# Patient Record
Sex: Male | Born: 1940 | Race: White | Hispanic: No | Smoking: Never smoker
Health system: Southern US, Community
[De-identification: ages and names within clinical notes are randomized; demographics above are authoritative.]

## PROBLEM LIST (undated history)

## (undated) DIAGNOSIS — I509 Heart failure, unspecified: Secondary | ICD-10-CM

---

## 2005-11-05 ENCOUNTER — Encounter
Admission: RE | Admit: 2005-11-05 | Discharge: 2005-11-05 | Payer: Self-pay | Admitting: Physical Medicine and Rehabilitation

## 2006-01-09 ENCOUNTER — Encounter
Admission: RE | Admit: 2006-01-09 | Discharge: 2006-01-09 | Payer: Self-pay | Admitting: Physical Medicine and Rehabilitation

## 2006-01-25 ENCOUNTER — Encounter: Admission: RE | Admit: 2006-01-25 | Discharge: 2006-01-25 | Payer: Self-pay | Admitting: Orthopedic Surgery

## 2006-04-18 ENCOUNTER — Encounter: Admission: RE | Admit: 2006-04-18 | Discharge: 2006-04-18 | Payer: Self-pay | Admitting: Orthopedic Surgery

## 2006-12-25 ENCOUNTER — Observation Stay (HOSPITAL_COMMUNITY): Admission: AD | Admit: 2006-12-25 | Discharge: 2006-12-26 | Payer: Self-pay | Admitting: Neurological Surgery

## 2007-05-29 ENCOUNTER — Ambulatory Visit: Payer: Self-pay | Admitting: Thoracic Surgery (Cardiothoracic Vascular Surgery)

## 2007-07-30 ENCOUNTER — Ambulatory Visit: Payer: Self-pay | Admitting: Cardiothoracic Surgery

## 2007-08-18 ENCOUNTER — Inpatient Hospital Stay (HOSPITAL_COMMUNITY): Admission: RE | Admit: 2007-08-18 | Discharge: 2007-08-21 | Payer: Self-pay | Admitting: Orthopedic Surgery

## 2007-09-02 ENCOUNTER — Encounter: Admission: RE | Admit: 2007-09-02 | Discharge: 2007-09-02 | Payer: Self-pay | Admitting: Orthopedic Surgery

## 2008-03-15 ENCOUNTER — Encounter: Admission: RE | Admit: 2008-03-15 | Discharge: 2008-03-15 | Payer: Self-pay | Admitting: Neurological Surgery

## 2009-02-07 IMAGING — CR DG CHEST 2V
2 series · 2 of 2 positions shown · non-contrast
Comparison: None

CLINICAL DATA: Lumbar HNP, preop

CHEST - 2 VIEW:

[view not recorded (1 of 2)]
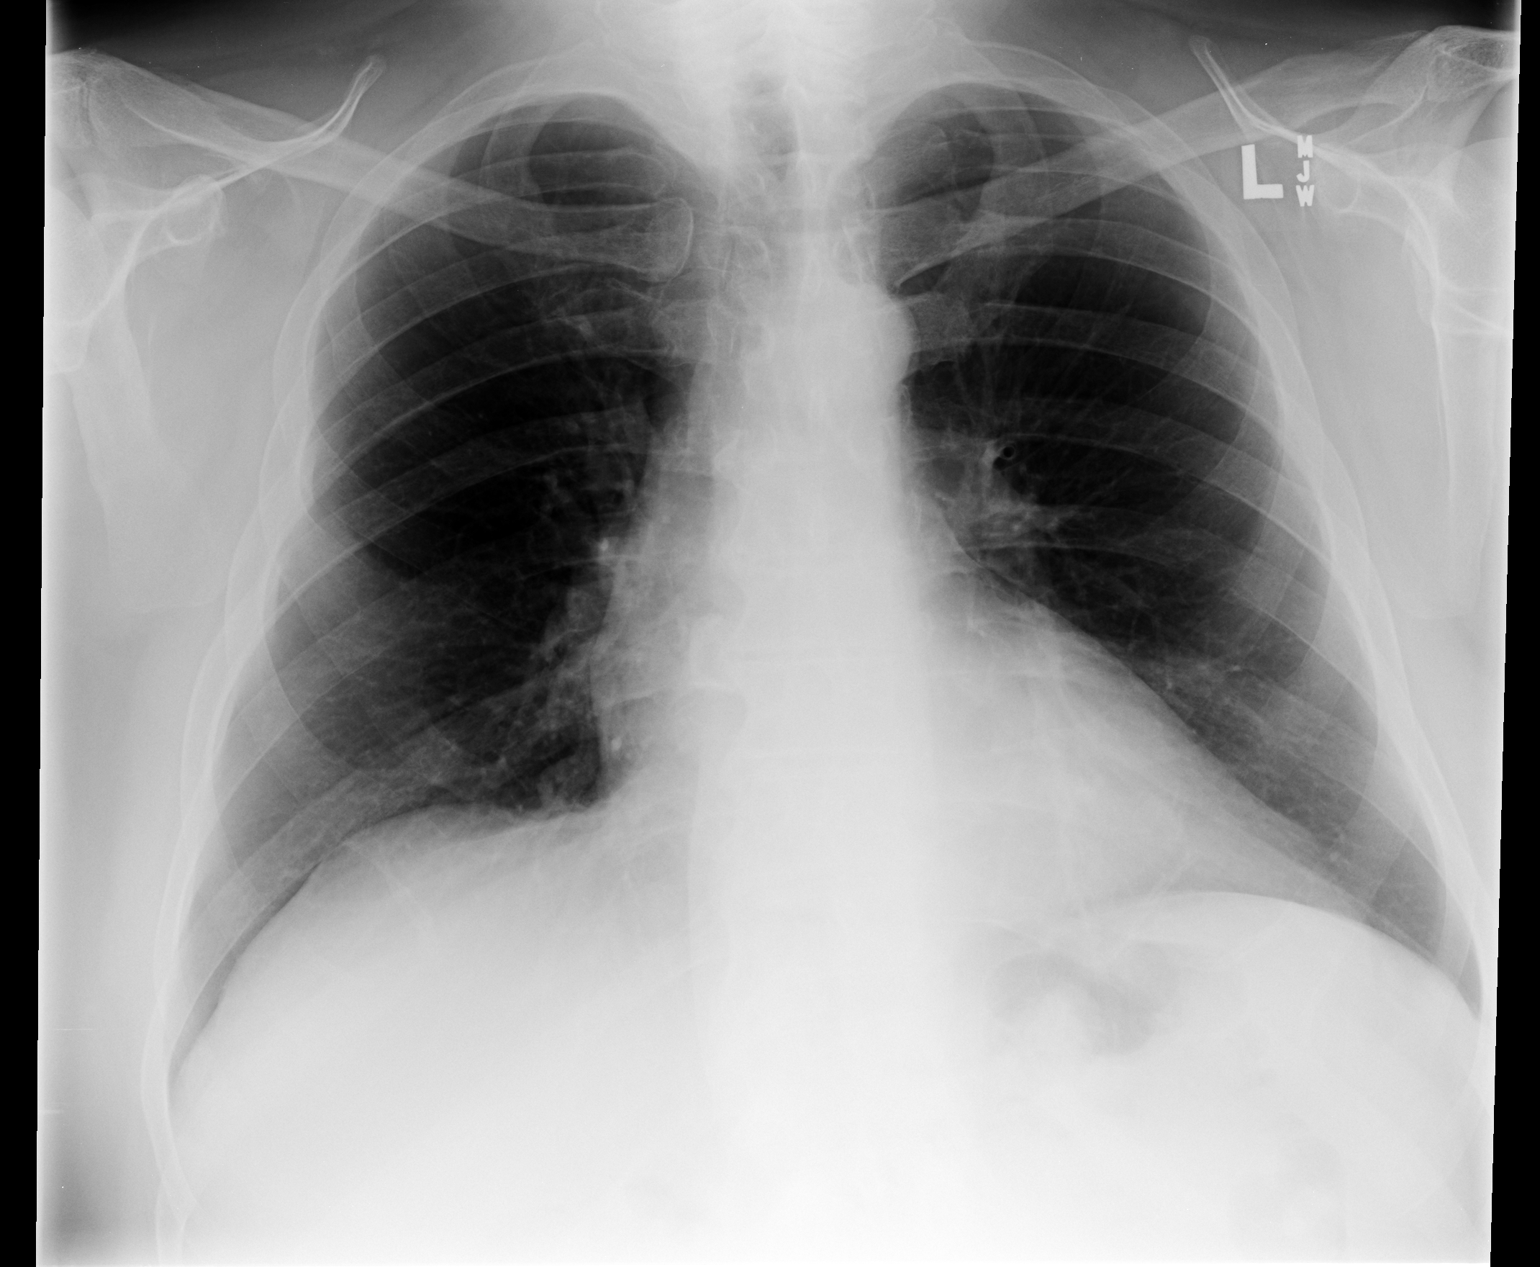

[view not recorded (2 of 2)]
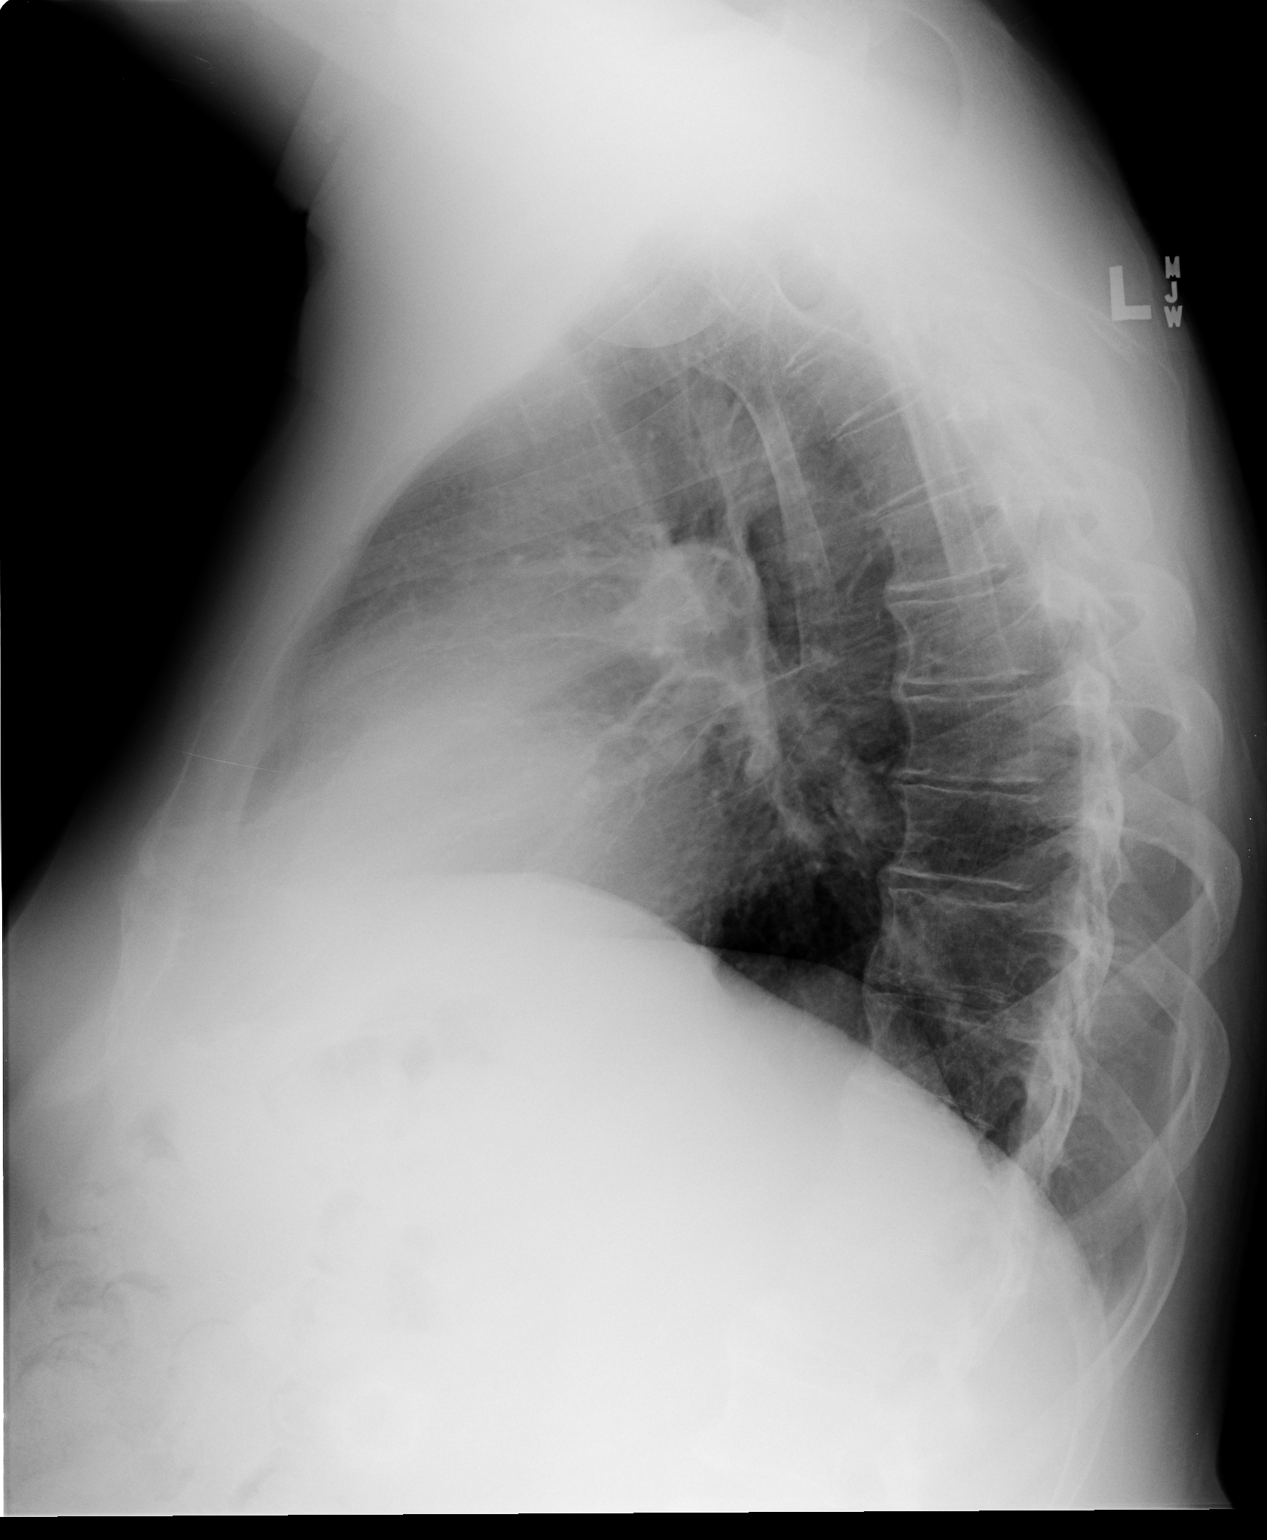

[2 of 2 positions shown; findings below may reference images not displayed]

FINDINGS: Heart is upper limits of normal in size. Mediastinal contours are
within normal limits. No focal opacities or effusions. Mild degenerative changes
in the thoracic spine.
IMPRESSION: No active disease.

## 2009-10-02 IMAGING — CR DG HIP COMPLETE 2+V*R*
3 series · 3 of 3 positions shown · non-contrast
Comparison: 11/05/05.

CLINICAL DATA: Preop, osteoarthritis right hip.
 RIGHT HIP - 2 VIEW AND PELVIS - 1 VIEW:

[t pelvis a.p.]
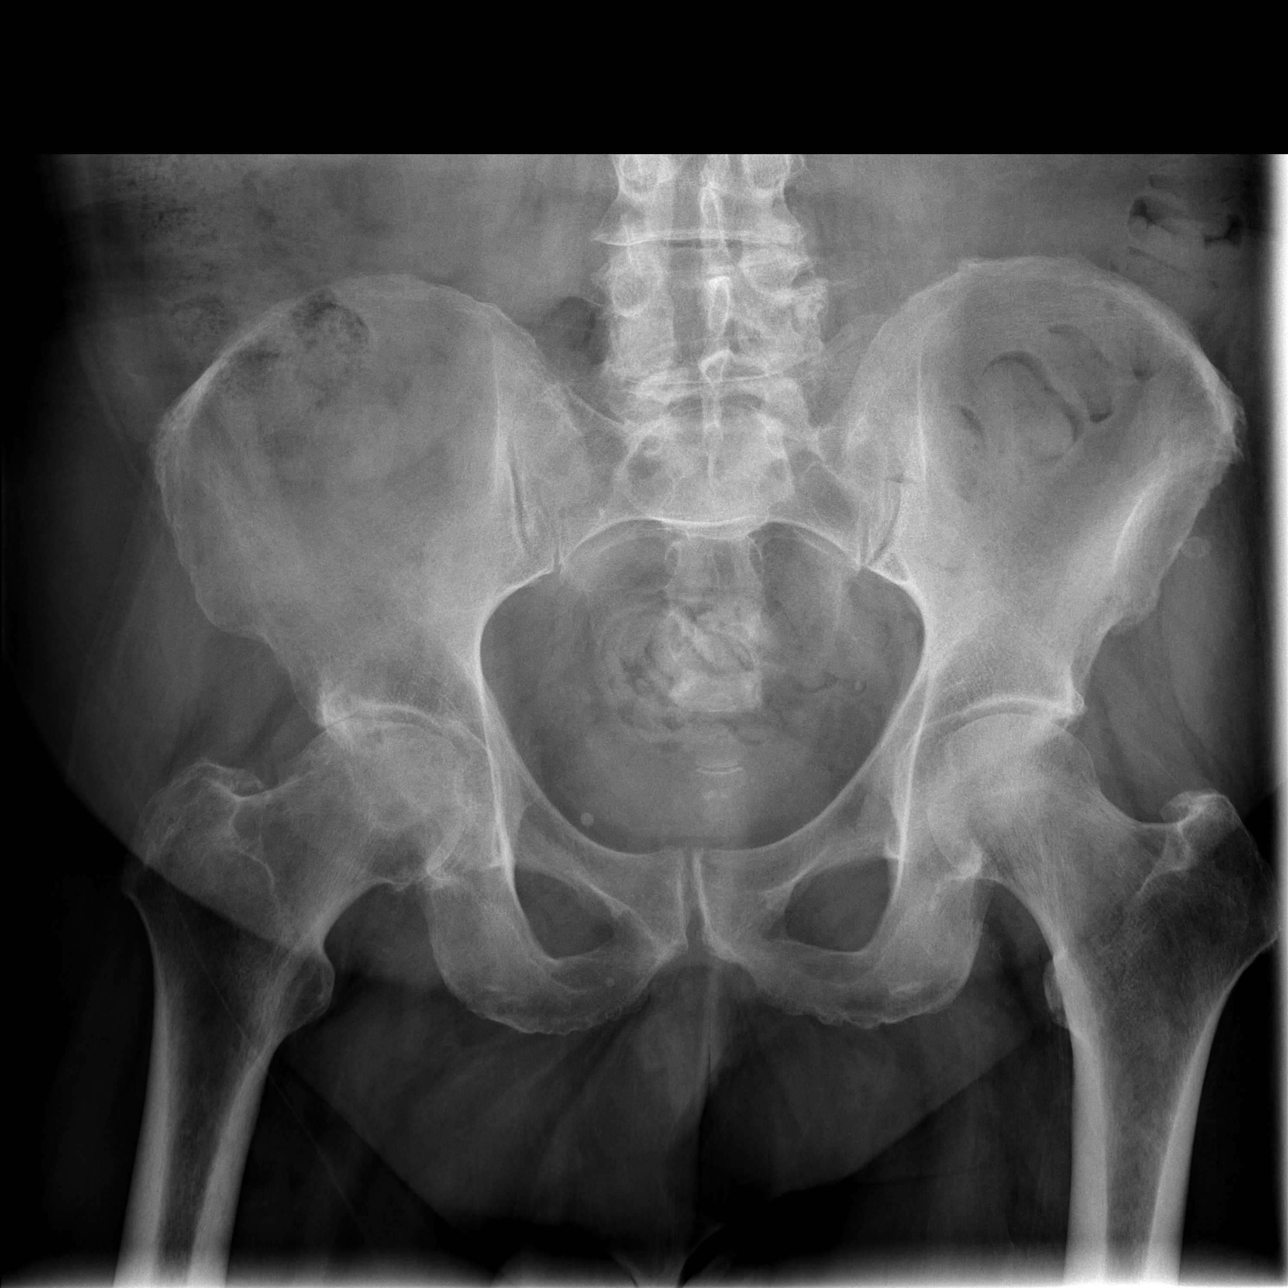

[t hip ap right]
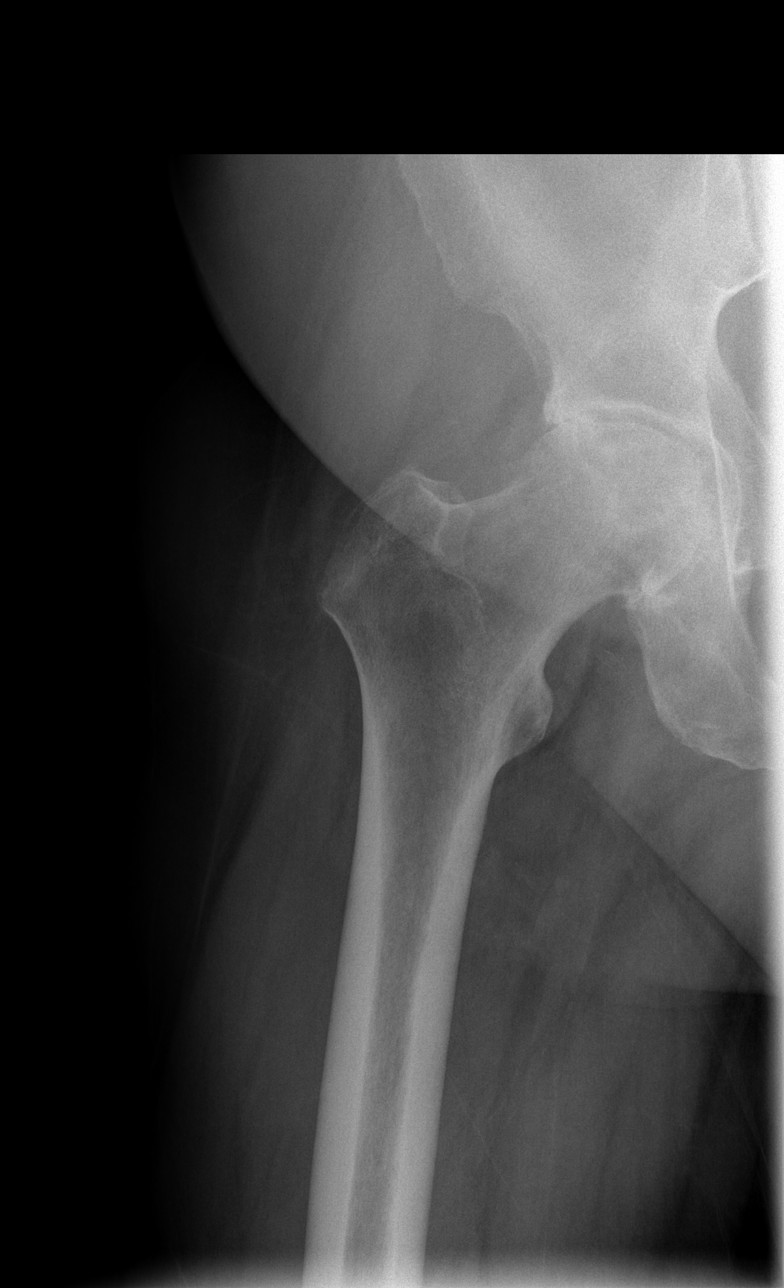

[t hip frog leg right]
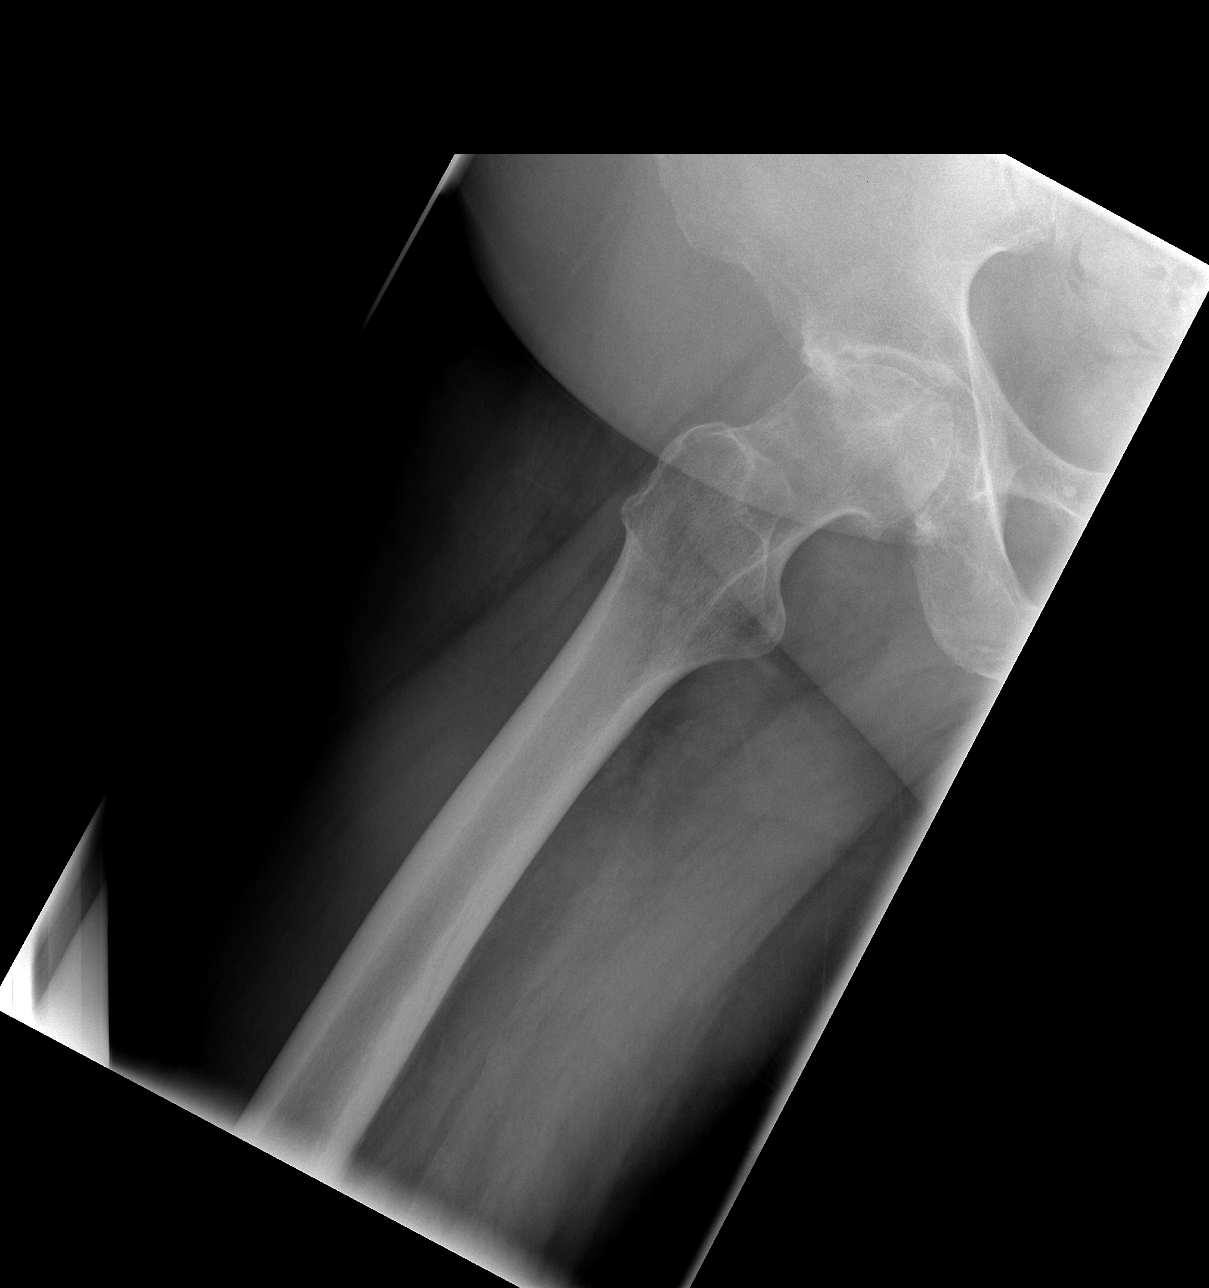

[3 of 3 positions shown; findings below may reference images not displayed]

FINDINGS: There is hip osteoarthritis, right worse than left.  Superior aspect of the right femoral head demonstrates flattening possibly due to avascular necrosis.  Much milder degree of left hip degenerative change is noted.
IMPRESSION: Advanced right hip osteoarthritis with flattening at the femoral head possibly due to avascular necrosis.

## 2009-10-07 IMAGING — CR DG PORTABLE PELVIS
1 series · 1 of 1 positions shown · non-contrast
Comparison: none

CLINICAL DATA: Right total hip arthroplasty. 
 PORTABLE PELVIS - 08/18/2007:

[view not recorded]
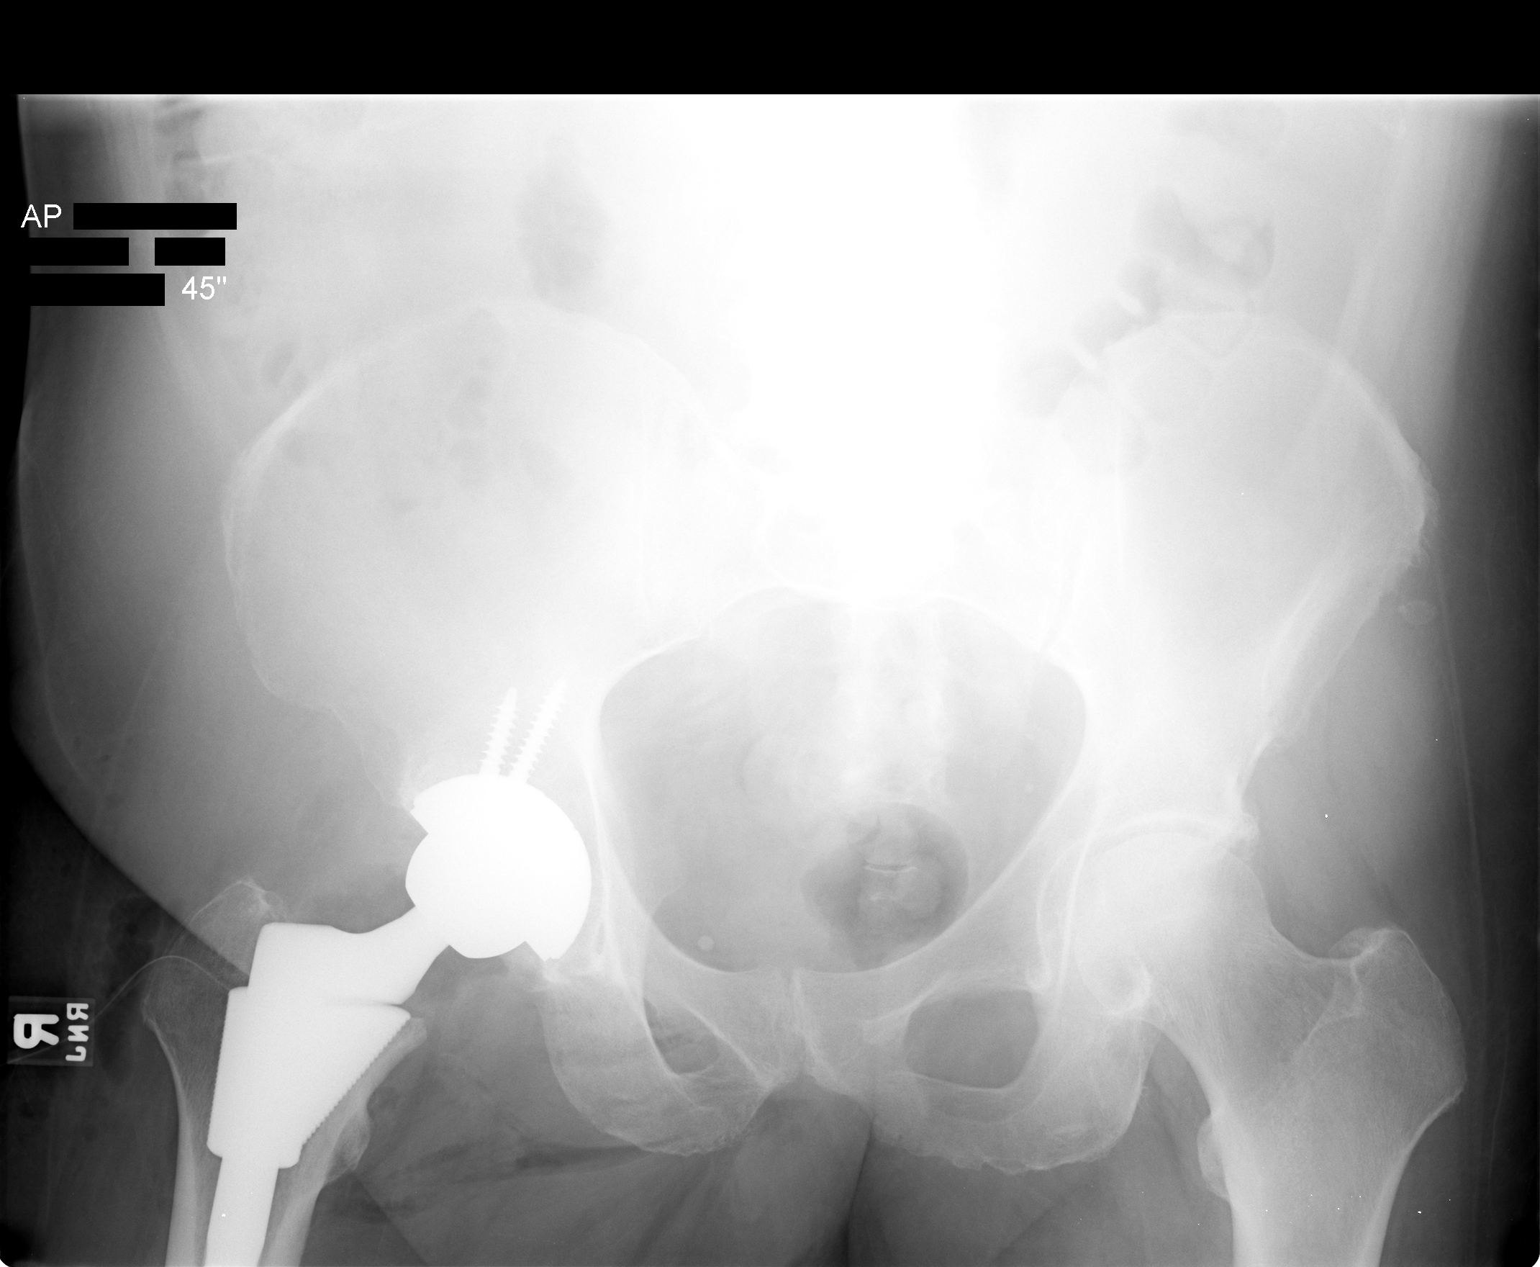

[1 of 1 positions shown; findings below may reference images not displayed]

FINDINGS: The patient is status post right total hip arthroplasty.  Subcutaneous air and drain are noted.  Degenerative changes in the left hip.
IMPRESSION: Right total hip arthroplasty without immediate complicating feature.

## 2010-12-05 NOTE — Op Note (Signed)
NAMEBROCKTON, MCKESSON NO.:  [id number]   MEDICAL RECORD NO.:  [id number]          PATIENT TYPE:  INP   LOCATION:  2899                         FACILITY:  [facility]   PHYSICIAN:  Tia Alert, MD     DATE OF BIRTH:  08-Jun-1941   DATE OF PROCEDURE:  12/25/2006  DATE OF DISCHARGE:                               OPERATIVE REPORT   PREOPERATIVE DIAGNOSIS:  Lumbar spinal stenosis with lumbar disk  herniation, L3-4, L4-5 on the right, with right-sided leg pain.   POSTOPERATIVE DIAGNOSIS:  Lumbar spinal stenosis with lumbar disk  herniation, L3-4, L4-5 on the right, with right-sided leg pain.   PROCEDURES:  Decompressive lumbar hemilaminectomy, medial facetectomy  and foraminotomy L3-4, L4-5, on the right. followed by microdiskectomy,  L4-5 and L3-4 on the right, utilizing microscopic dissection.   SURGEON:  Tia Alert, MD   ASSISTANT:  Donalee Citrin, MD   ANESTHESIA:  General endotracheal.   COMPLICATIONS:  None apparent.   INDICATIONS FOR PROCEDURE:  Mr. Popov is a 70 year old gentleman who  was referred with severe right leg pain going down his anterior thigh  and into his groin.  He had associated numbness.  He had tried medical  management for quite some time without significant relief.  He had an  MRI, which showed lateral recess stenosis at L3-4 and L4-5 on the right  with broad-based disk bulges.  I recommended a decompressive  hemilaminectomy at L3-4 and L4-5 on the right followed by possible  microdiskectomy.  He understood the risks, benefits, and expected  outcome and wished to proceed.   DESCRIPTION OF PROCEDURE:  The patient was taken to the operating room  and after induction of adequate generalized endotracheal anesthesia, he  was rolled into the prone position on the Wilson frame and all pressure  points were padded.  His lumbar region was prepped with DuraPrep and  then draped in the usual sterile fashion.  Local anesthesia 10 mL was  injected  and a small dorsal midline incision was made and carried down  to the lumbosacral fascia.  The fascia was opened on the patient's right  side and taken down in a subperiosteal fashion to expose L3-4 and L4-5  on the right side.  Intraoperative x-ray confirmed my level and then I  used the high-speed drill and the Kerrison punches to perform a  hemilaminectomy, medial facetectomy, and foraminotomy at L3-4 and L4-5  on the right side.  I extended my laminectomy at L3-4 all the way up to  the pedicle level of L3, identified the L3 nerve root, decompressed it  into the foramen.  I decompressed the lateral recess by undercutting the  facet.  The yellow ligament was removed in a piecemeal fashion to expose  the underlying dura and L4 nerve root, which was then decompressed by  dissecting along the medial pedicle wall.  I then coagulated the  epidural venous vasculature and found a significant subannular disk  bulge.  I continued to decompress the lateral recess to make sure the L3  and L4 nerve roots were decompressed.  I  then used the high-speed drill  and the Kerrison punch to decompress the L4-5 level also by performing a  hemilaminectomy, medial facetectomy and foraminotomy at L4-5 on the  right side.  The underlying yellow ligament was removed in a piecemeal  fashion to expose the underlying dura and L5 nerve root.  Again I  decompressed lateral recess by undercutting the facet.  I then brought  in the operating room microscope, coagulated the epidural venous  vasculature once again at L3-4 and then performed a thorough intradiskal  diskectomy at L3-4 and then palpated with a coronary dilator to assure  adequate decompression at this level, palpated into the foramen and into  the midline along the L3 and L4 nerve roots.  I then performed the same  diskectomy at L4-5 by opening the disk space and then performing a  thorough intradiskal diskectomy.  I then palpated with a coronary  dilator  into the midline, into the foramen, and along the nerve roots  again to assure adequate decompression on the nerve roots and central  canal.  I felt like I had a good diskectomy at both levels.  I then  irrigated with saline solution containing bacitracin, inspected the  nerve roots once again, lined the dura with Duragen to prevent epidural  fibrosis, and then dried all bleeding points and once hemostasis was  achieved closed the fascia with 0 Vicryl, closed the subcutaneous and  subcuticular tissue using 2-0 and 3-0 Vicryl and closed the skin with  Benzoin and Steri-Strips.  The drapes were removed.  A sterile dressing  was applied.  The patient was awakened from general anesthesia and  transported to the recovery room in stable condition.  At the end of the  procedure all sponge, needle and instrument counts were correct.      Tia Alert, MD  Electronically Signed     DSJ/MEDQ  D:  12/25/2006  T:  12/25/2006  Job:  [id number]

## 2010-12-05 NOTE — Op Note (Signed)
NAMEEMBRY, Cox NO.:  [id number]   MEDICAL RECORD NO.:  [id number]          PATIENT TYPE:  INP   LOCATION:  1605                         FACILITY:  [facility]   PHYSICIAN:  Ollen Gross, M.D.    DATE OF BIRTH:  10-Jul-1941   DATE OF PROCEDURE:  08/18/2007  DATE OF DISCHARGE:                               OPERATIVE REPORT   PREOPERATIVE DIAGNOSES:  Osteoarthritis and osteonecrosis right hip.   POSTOPERATIVE DIAGNOSES:  Osteoarthritis and osteonecrosis right hip.   PROCEDURE:  Right total hip arthroplasty.   SURGEON:  Dr. Homero Fellers Aluisio.   ASSISTANT:  Avel Peace, PA-C.   ANESTHESIA:  General.   ESTIMATED BLOOD LOSS:  300.   DRAINS:  Hemovac x1.   COMPLICATIONS:  None.   CONDITION:  Stable to recovery.   BRIEF CLINICAL NOTE:  [name] is a 70 year old male with end-stage  arthritis of the right hip with an element of  osteonecrosis also.  He  has had markedly progressive pain and worsening dysfunction.  He  presents for total hip arthroplasty.   PROCEDURE IN DETAIL:  After successful administration of general  anesthetic, the patient is placed in the left lateral decubitus  position, right side up and held with a hip positioner.  The right lower  extremity was isolated from his perineum with plastic drapes and prepped  and draped in the usual sterile fashion.  A short posterolateral  incision is made with a 10 blade through the subcutaneous tissue to the  level of the fascia lata which was incised in line with the skin  incision. The sciatic nerve was palpated and protected and the short  rotator was isolated off the femur. Capsulectomy was performed and the  hip was dislocated. The center of the femoral head is marked and a trial  prosthesis placed such that the center of the trial head corresponds  with the center of his native femoral head. The osteotomy line is marked  in the femoral neck and osteotomy made with an oscillating saw. The  femoral head is removed. The cartilage was essentially peeling off like  an orange peel. He had a lot of areas of exposed bone with large  osteophytes. The femur was then retracted anteriorly to gain acetabular  exposure.   Acetabular retractors were placed and labrum and osteophytes removed.  Acetabular reaming starts at 45 coursing in increments of 2 to 53 and  then a 54 mm pinnacle acetabular shell was placed in anatomic position  and transfixed with two dome screws.  The apex hole eliminator is placed  and then a 36 mm neutral Ultamet metal liner is placed.   The femur is prepared with the canal finder and irrigation.  Axial  reaming is performed up to 13.5 mm, proximal reaming to a 39F.  This was  the oversized sleeve for the 18 x 13.  It is machined to an extra extra  large.  The 39F extra extra large sleeve with the internal diameter for  1813 is placed and then an 18 x 13 stem and a 36 plus 12 neck matching  native anteversion.  A 36 plus 0 head is placed and reduced a little too  easily. I went to a 36 plus 6 which had excellent reduction with  appropriate soft tissue tension.  He had great stability with full  extension, full external rotation, 70 degrees flexion, 40 degrees  adduction, 90 degrees internal rotation and 90 degrees of flexion and 70  degrees of internal rotation.  By placing the right leg on top of the  left, the leg lengths were found to be equal.  The hip was then  dislocated and all trials removed.  The permanent 20 XXL sleeve with the  18 x 13 internal diameter is placed and then the 18 x 13 stem and 36  plus 12 neck matching native anteversion is placed.  A 36 plus 6 head is  placed and the hip is reduced with the same stability parameters.  The  wound was copiously irrigated with saline solution and short rotators  reattached to the femur through drill holes.  The fascia lata was closed  over a  Hemovac drain with interrupted #1 Vicryl, subcu closed with  #1  and 2-0 Vicryl and subcuticular with running 4-0 Monocryl.  The drain is  hooked to suction, incision cleaned and dried and Steri-Strips and bulky  sterile dressing are applied.  He is then placed into a knee  immobilizer, awakened and transported to recovery in stable condition.      Ollen Gross, M.D.  Electronically Signed     FA/MEDQ  D:  08/18/2007  T:  08/18/2007  Job:  [id number]

## 2010-12-05 NOTE — H&P (Signed)
[name], [name] NO.:  [id number]   MEDICAL RECORD NO.:  [id number]          PATIENT TYPE:  INP   LOCATION:  NA                           FACILITY:  [facility]   PHYSICIAN:  Ollen Gross, M.D.    DATE OF BIRTH:  [birth year]   DATE OF ADMISSION:  06/09/2007  DATE OF DISCHARGE:                              HISTORY & PHYSICAL   Date of office visit and physical May 27, 2007.   CHIEF COMPLAINT:  Right hip pain.   HISTORY OF PRESENT ILLNESS:  The patient is a 70 year old male who has  been seen by Dr. Lequita Halt for ongoing right hip pain.  He is known to  have end-stage arthritis and has had further collapse of the femoral  head to the point where he is bone on bone in the superior lateral  aspect.  He has had progressive pain, and it has got to a point where it  is hurting him all the time.  It is felt he would benefit from  undergoing surgical intervention.  Risks and benefits have been  discussed.  He elects to proceed with surgery.  He has been seen  preoperatively by Dr. Derrell Lolling and found to have an abnormal stress test  during his clearance.  At the time of his History and Physical on  May 27, 2007 he had a pending cardiac clearance, and  was having a  cardiac catheterization in a couple of days.  Did not have that  information at the time of the dictation.   ALLERGIES:  MEVACOR, PENICILLIN.   CURRENT MEDICATIONS:  Pilar Grammes, Hytrin, Lopid, Accupril, Percocet,  Dulcolax.   PAST MEDICAL HISTORY:  Hypertension, rosacea, hyperlipidemia, esophageal  reflux, hemorrhoids, history of urinary tract infections, most recent  one approximately a month ago, non-insulin-dependent diabetes mellitus,  basal cell carcinoma of the skin, history of colon polyps removed, first  degree AV block, lumbar degenerative disk disease, history of sciatica.   PAST SURGICAL HISTORY:  Polypectomy approximately three years ago, right  eye cataract surgery June 2000, left eye  cataract surgery September  2006, right eyelid defect repaired January 2007, removal of skin cancer  from back October 2004, and lumbar microdiskectomy June 2008.   SOCIAL HISTORY:  Divorced, retired, nonsmoker.  No alcohol.  Three  children.  Mother and brother will be assisting with care after surgery.   FAMILY HISTORY:  Mother with history of arthritis.  One grandfather with  history of stroke, grandmother with history of cancer.   REVIEW OF SYSTEMS:  GENERAL:  No fevers, chills, night sweats.  NEUROLOGICAL:  No seizures, syncope or paralysis.  RESPIRATORY:  No  shortness of breath, productive cough or hemoptysis.  CARDIOVASCULAR:  No chest pain, angina, orthopnea.  GI:  A little bit of constipation  secondary to medications.  No diarrhea, no nausea, vomiting.  GU:  History of urinary tract infection approximately a month ago, a little  bit of urgency, nocturia.  No dysuria, hematuria.  MUSCULOSKELETAL:  Right hip.   PHYSICAL EXAMINATION:  VITAL SIGNS:  Pulse 68, respirations 14, blood  pressure 124/70.  GENERAL:  A 70 year old white male well-nourished, well-developed in no  acute distress, alert, oriented, and cooperative, pleasant.  HEENT:  Normocephalic, atraumatic.  Pupils are round and reactive.  Oropharynx clear.  EOMs intact.  NECK:  Supple.  CHEST:  Clear anterior and posterior chest walls.  HEART:  Regular rate and rhythm.  No murmur, S1, S2 noted.  ABDOMEN:  Soft, nontender.  Bowel sounds present.  RECTAL/BREASTS/GENITALIA:  Not done and not pertinent to present  illness.  EXTREMITIES:  Right hip flexion 90;  0 internal rotation,  10 degrees  external rotation, 10-20 degrees abduction.   PLAN:  The patient admitted to [facility] to undergo right  total hip replacement arthroplasty.  Surgery will be performed by Dr.  Ollen Gross.  Again at the time of the History and Physical the  cardiac clearance was pending cardiac catheterization results.       Alexzandrew L. Perkins, P.A.C.      Ollen Gross, M.D.  Electronically Signed    ALP/MEDQ  D:  06/08/2007  T:  06/09/2007  Job:  [id number]   cc:   Synetta Fail, M.D.  [facility], [place]

## 2010-12-05 NOTE — H&P (Signed)
[name], [name] NO.:  [id number]   MEDICAL RECORD NO.:  [id number]          PATIENT TYPE:  INP   LOCATION:  1605                         [facility]ACILITY[facility]   PHYSICIAN:  Ollen Gross, M.D.    DATE OF BIRTH:  05-05-1941   DATE OF ADMISSION:  08/18/2007  DATE OF DISCHARGE:  08/21/2007                              HISTORY & PHYSICAL   Date of office visit history and physical performed on August 05, 2007.  Date of admission August 18, 2007.   CHIEF COMPLAINT:  Right hip pain.   HISTORY OF PRESENT ILLNESS:  The patient is a 70 year old male who has  been seen by Dr. Lequita Halt for ongoing right hip pain with osteonecrosis  and osteoarthritis and felt to be a good candidate for surgery.  He was  originally scheduled for November of last year but in his preop he had  abnormal stress test workup.  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  He has since done well from that,  cleared and presents now for his hip replacement.   ALLERGIES:  MEVACOR, PENICILLIN.   CURRENT MEDICATIONS:  1. Cholestyramine Light Powder 2 scoops once a day.  2. Quinapril 40 mg daily.  3. Gemfibrozil 600 mg twice a day.  4. Metoprolol 25 mg twice a day.  5. Oxycodone 5 p.r.n. pain.  6. Aspirin 81 mg daily.  7. MiraLax 17 grams in 8 ounces of water daily.   PAST MEDICAL HISTORY:  Hypertension, rosacea, hyperlipidemia, coronary  arterial disease, esophageal reflux, hemorrhoids, history of UTIs, non-  insulin-dependent diabetes mellitus, history of skin cancers, history of  colon polyps, first degree AV block, lumbar degenerative disk disease.   PAST SURGICAL HISTORY:  Cataract surgery bilaterally, repair eyelid  defects, removal of skin cancer, lumbar microdiskectomy and recent three  vessel CABG June 09, 2007.   SOCIAL HISTORY:  Divorced, retired, nonsmoker, no alcohol, 3  children.   FAMILY HISTORY:  Grandmother with stroke.  Mother with arthritis.  Another grandmother with cancer.   REVIEW OF SYSTEMS:  GENERAL:  No fevers, chills, night sweats.  NEURO:  No seizures, syncope or paralysis.  RESPIRATORY:  No shortness of  breath, productive cough or hemoptysis.  CARDIOVASCULAR:  No chest pain,  ____________ although he has recent bypass surgery.  GI:  No nausea,  vomiting, diarrhea or constipation.  Does have reflux.  GU:  No dysuria,  hematuria, little bit of urgency, little bit of nocturia.  MUSCULOSKELETAL:  Right hip.   PHYSICAL EXAMINATION:  VITAL SIGNS:  Pulse 56, respirations 12, blood  pressure 138/70.  GENERAL:  A 70 year old white male well-nourished, well-developed,  overweight, no acute distress, alert and oriented and cooperative.  Good  historian.  HEENT:  Normocephalic, atraumatic.  Pupils are round and reactive.  EOMs  intact.  NECK:  Supple.  No bruits.  CHEST:  Clear anterior and posterior chest walls.  No rhonchi, rales or  wheezing.  HEART:  Regular rate and rhythm.  No murmur,  S1-S2 normal.  ABDOMEN:  Soft, round.  Bowel sounds present.  RECTAL:  Not done.  Not pertinent to present illness.  BREASTS:  Not done.  Not pertinent to present illness.  GENITALIA:  Not done.  Not pertinent present illness.  EXTREMITIES:  Right hip flexion 90, zero internal rotation, 10 degrees  external rotation, 10-20 degrees abduction, painful range of motion.   IMPRESSION:  Osteoarthritis right hip.   PLAN:  The patient was admitted to [facility] to undergo right  total hip replacement arthroplasty.  Surgery will be performed by Dr.  Ollen Gross.      Alexzandrew L. Perkins, P.A.C.      Ollen Gross, M.D.  Electronically Signed    ALP/MEDQ  D:  08/23/2007  T:  08/24/2007  Job:  [id number]   cc:   Beverely Pace, Dr  [facility]   Tera Mater. Arvilla Market, MD  [address], [place] [address]   Synetta Fail, Dr   [facility]   Ollen Gross, M.D.  Fax: [phone]

## 2010-12-08 NOTE — Discharge Summary (Signed)
NAMESAMARION, EHLE NO.:  [id number]   MEDICAL RECORD NO.:  [id number]          PATIENT TYPE:  INP   LOCATION:  1605                         FACILITY:  [facility]   PHYSICIAN:  Ollen Gross, M.D.    DATE OF BIRTH:  [birth year]   DATE OF ADMISSION:  08/18/2007  DATE OF DISCHARGE:  08/21/2007                               DISCHARGE SUMMARY   ADMITTING DIAGNOSES:  1. Osteoarthritis, right hip.  2. Hypertension.  3. Rosacea.  4. Hyperlipidemia.  5. Coronary arterial disease.  6. Esophageal reflux.  7. Hemorrhoids.  8. History of urinary tract infections.  9. Non-insulin-dependent diabetes mellitus.  10.History of skin cancer.  11.History of colon polyps.  12.First-degree AV block.  13.Lumbar degenerative disk disease.   DISCHARGE DIAGNOSES:  1. Osteoarthritis, right hip, with osteonecrosis status post right      total hip replacement arthroplasty.  2. Mild postoperative blood loss anemia.  3. Postoperative hypokalemia improved.  4. Hypertension.  5. Rosacea.  6. Hyperlipidemia.  7. Coronary arterial disease.  8. Esophageal reflux.  9. Hemorrhoids.  10.History of urinary tract infections.  11.Non-insulin-dependent diabetes mellitus.  12.History of skin cancer.  13.History of colon polyps.  14.First-degree AV block.  15.Lumbar degenerative disk disease.   PROCEDURE:  August 18, 2007, right total hip.  Surgeon Dr. Lequita Halt,  assistant Avel Peace. P.A.-C.  Anesthesia general.   CONSULTS:  None.   BRIEF HISTORY:  Mr. Delap is a 70 year old male with end-stage  arthritis of right hip with element of osteonecrosis and progressive  worsening pain, now presents for total hip.   LABORATORY DATA:  Preoperative CBC showed a hemoglobin 13.2, hematocrit  38.4, white cell count 6.6, platelets 222.  Postoperative hemoglobin  11.5, drifted down to 10; last noted H&H 9.5 and 26.9.  PT/PTT  preoperative 13.1 and 60, respectively.  INR 1.0.  Serial protimes  followed.  Last noted PT/INR 34.1 and 3.2.  Chem panel on admission all  within normal limits with exception of minimally elevated glucose of  121.  Serial BMETs were followed.  Potassium dropped to 3.6-3.4, back up  to 4.3.  Remaining electrolytes remained within normal limits.  Preoperative UA negative.  Blood group type A+.   X-RAYS:  Hip films on August 13, 2007:  Advanced right osteoarthritis  with flattening of the head possibly due to AVN.  Portable hip and  pelvis film on August 18, 2007:  Right total hip arthroplasty without  immediate complicating feature.   HOSPITAL COURSE:  The patient was admitted to [facility],  tolerated the procedure well, later returned to the recovery room and  the orthopedic floor.  Started on PCA and p.o. analgesic pain control  following surgery.  Did fairly well on the evening of surgery.  The  morning of day 1, did have some pain.  Encouraged p.o. medications.  Discontinued the PCA later on day 1.  Started back on home medications.  Blood pressure medications were put on parameters.  Hemoglobin looked  good, 11.5.  Started getting up out of bed with therapy.  Walked about  12 feet  on day 1.  By day 2, he was doing a little bit better.  Pain was  under much better control.  Dressing changed; incision looked good.  Discontinued the fluids and the IV.  Hemoglobin was 10.  Labs looked  good.  Progressing well with physical therapy, and by day 3, doing well,  meeting his goals with therapy, maintaining his partial hip  weightbearing precautions, and was discharged home.   DISCHARGE PLAN:  The patient was discharged home on August 21, 2007.   DISCHARGE DIAGNOSES:  Please see above.   DISCHARGE MEDICATIONS:  1. Dilaudid.  2. Robaxin.  3. Coumadin.   DIET:  Low-sodium, heart-healthy diet.   FOLLOW-UP:  2 weeks.   ACTIVITY:  Partial weightbearing 25-50%, right lower extremity; hip  precautions, total hip protocol.   DISPOSITION:   Home.   CONDITION ON DISCHARGE:  Improving.      Alexzandrew L. Perkins, P.A.C.      Ollen Gross, M.D.  Electronically Signed    ALP/MEDQ  D:  09/30/2007  T:  10/01/2007  Job:  [id number]   cc:   Ollen Gross, M.D.  Fax: [phone]   Beverely Pace, M.D.  [facility]   Jeannett Senior A. Arvilla Market, MD  [address], [place] [address]   Synetta Fail, M.D.  [facility]

## 2011-04-12 LAB — COMPREHENSIVE METABOLIC PANEL
Albumin: 3.8
BUN: 17
CO2: 24
Chloride: 112
GFR calc Af Amer: >60
Potassium: 3.6
Sodium: 141
Total Bilirubin: 0.6

## 2011-04-12 LAB — BASIC METABOLIC PANEL
BUN: 10
BUN: 9
CO2: 22
CO2: 22
Calcium: 8.5
Creatinine, Ser: 0.69
Glucose, Bld: 142 — ABNORMAL HIGH
Potassium: 3.4 — ABNORMAL LOW
Sodium: 140

## 2011-04-12 LAB — CBC
HCT: 26.9 — ABNORMAL LOW
HCT: 33.4 — ABNORMAL LOW
Hemoglobin: 10 — ABNORMAL LOW
Hemoglobin: 11.5 — ABNORMAL LOW
MCHC: 34.3
MCHC: 34.4
MCHC: 35
MCV: 89.7
Platelets: 147 — ABNORMAL LOW
Platelets: 157
Platelets: 167
Platelets: 222
RBC: 3.69 — ABNORMAL LOW
RDW: 14.4
RDW: 14.4
WBC: 6.6

## 2011-04-12 LAB — URINALYSIS, ROUTINE W REFLEX MICROSCOPIC
Bilirubin Urine: NEGATIVE
Glucose, UA: NEGATIVE
Hgb urine dipstick: NEGATIVE
Nitrite: NEGATIVE
Protein, ur: NEGATIVE
pH: 6

## 2011-04-12 LAB — PROTIME-INR
INR: 1
INR: 1.1
INR: 2.5 — ABNORMAL HIGH
Prothrombin Time: 13.1
Prothrombin Time: 14.7

## 2011-04-12 LAB — APTT: aPTT: 44 — ABNORMAL HIGH

## 2019-04-29 ENCOUNTER — Emergency Department (HOSPITAL_BASED_OUTPATIENT_CLINIC_OR_DEPARTMENT_OTHER)
Admission: EM | Admit: 2019-04-29 | Discharge: 2019-04-29 | Disposition: A | Discharge status: Home / Self Care | Payer: Medicare HMO | Attending: Emergency Medicine | Admitting: Emergency Medicine

## 2019-04-29 ENCOUNTER — Other Ambulatory Visit: Payer: Self-pay

## 2019-04-29 ENCOUNTER — Encounter (HOSPITAL_BASED_OUTPATIENT_CLINIC_OR_DEPARTMENT_OTHER): Payer: Self-pay | Admitting: Emergency Medicine

## 2019-04-29 DIAGNOSIS — R600 Localized edema: Secondary | ICD-10-CM | POA: Insufficient documentation

## 2019-04-29 DIAGNOSIS — N189 Chronic kidney disease, unspecified: Secondary | ICD-10-CM | POA: Diagnosis not present

## 2019-04-29 DIAGNOSIS — Z7901 Long term (current) use of anticoagulants: Secondary | ICD-10-CM | POA: Diagnosis not present

## 2019-04-29 DIAGNOSIS — I509 Heart failure, unspecified: Secondary | ICD-10-CM | POA: Diagnosis not present

## 2019-04-29 DIAGNOSIS — R609 Edema, unspecified: Secondary | ICD-10-CM

## 2019-04-29 DIAGNOSIS — I4891 Unspecified atrial fibrillation: Secondary | ICD-10-CM | POA: Insufficient documentation

## 2019-04-29 HISTORY — DX: Heart failure, unspecified: I50.9

## 2019-04-29 LAB — CBC WITH DIFFERENTIAL/PLATELET
Abs Immature Granulocytes: 0.04 10*3/uL (ref 0.00–0.07)
Basophils Absolute: 0 10*3/uL (ref 0.0–0.1)
Basophils Relative: 0 %
Eosinophils Absolute: 0.1 10*3/uL (ref 0.0–0.5)
Eosinophils Relative: 1 %
HCT: 37.9 % — ABNORMAL LOW (ref 39.0–52.0)
Hemoglobin: 12.2 g/dL — ABNORMAL LOW (ref 13.0–17.0)
Immature Granulocytes: 0 %
Lymphocytes Relative: 9 %
Lymphs Abs: 1.3 10*3/uL (ref 0.7–4.0)
MCH: 31.8 pg (ref 26.0–34.0)
MCHC: 32.2 g/dL (ref 30.0–36.0)
MCV: 98.7 fL (ref 80.0–100.0)
Monocytes Absolute: 0.8 10*3/uL (ref 0.1–1.0)
Monocytes Relative: 6 %
Neutro Abs: 11.5 10*3/uL — ABNORMAL HIGH (ref 1.7–7.7)
Neutrophils Relative %: 84 %
Platelets: 275 10*3/uL (ref 150–400)
RBC: 3.84 MIL/uL — ABNORMAL LOW (ref 4.22–5.81)
RDW: 14.8 % (ref 11.5–15.5)
WBC: 13.7 10*3/uL — ABNORMAL HIGH (ref 4.0–10.5)
nRBC: 0 % (ref 0.0–0.2)

## 2019-04-29 LAB — BASIC METABOLIC PANEL
Anion gap: 14 (ref 5–15)
BUN: 64 mg/dL — ABNORMAL HIGH (ref 8–23)
CO2: 21 mmol/L — ABNORMAL LOW (ref 22–32)
Calcium: 9.3 mg/dL (ref 8.9–10.3)
Chloride: 102 mmol/L (ref 98–111)
Creatinine, Ser: 2.35 mg/dL — ABNORMAL HIGH (ref 0.61–1.24)
GFR calc Af Amer: 29 mL/min — ABNORMAL LOW (ref >60)
GFR calc non Af Amer: 25 mL/min — ABNORMAL LOW (ref >60)
Glucose, Bld: 143 mg/dL — ABNORMAL HIGH (ref 70–99)
Potassium: 3.6 mmol/L (ref 3.5–5.1)
Sodium: 137 mmol/L (ref 135–145)

## 2019-04-29 LAB — LACTIC ACID, PLASMA
Lactic Acid, Venous: 2.2 mmol/L (ref 0.5–1.9)
Lactic Acid, Venous: 1.5 mmol/L (ref 0.5–1.9)

## 2019-04-29 LAB — MAGNESIUM: Magnesium: 2.2 mg/dL (ref 1.7–2.4)

## 2019-04-29 MED ORDER — CEPHALEXIN 250 MG PO CAPS: 250.0000 mg | ORAL_CAPSULE | Freq: Four times a day (QID) | ORAL | 0 refills | Status: AC

## 2019-04-29 MED ORDER — POTASSIUM CHLORIDE CRYS ER 20 MEQ PO TBCR
20.0000 meq | EXTENDED_RELEASE_TABLET | Freq: Once | ORAL | Status: AC
  Administered 2019-04-29: 20 meq via ORAL
  Filled 2019-04-29: qty 1

## 2019-04-29 MED ORDER — SODIUM CHLORIDE 0.9 % IV BOLUS
500.0000 mL | Freq: Once | INTRAVENOUS | Status: AC
  Administered 2019-04-29: 500 mL via INTRAVENOUS

## 2019-04-29 MED ORDER — FUROSEMIDE 10 MG/ML IJ SOLN
80.0000 mg | Freq: Once | INTRAMUSCULAR | Status: AC
  Administered 2019-04-29: 13:00:00 80 mg via INTRAVENOUS
  Filled 2019-04-29: qty 8

## 2019-04-29 MED ORDER — CEFAZOLIN SODIUM-DEXTROSE 1-4 GM/50ML-% IV SOLN
1.0000 g | Freq: Once | INTRAVENOUS | Status: AC
Start: 2019-04-29 — End: 2019-04-29
  Administered 2019-04-29: 1 g via INTRAVENOUS
  Filled 2019-04-29: qty 50

## 2019-04-29 MED FILL — CEPHALEXIN 250 MG CAPSULE: 250 | 7 days supply | Qty: 28 | Fill #0

## 2019-04-29 NOTE — ED Triage Notes (Signed)
Pt has hx of CHF and is retaining fluid in legs and now has a wound on the left lower leg that is not healing.

## 2019-04-29 NOTE — ED Provider Notes (Signed)
[facility]Y DEPARTMENT Provider Note   CSN: [id number] Arrival date & time: 04/29/19  1053     History   Chief Complaint Chief Complaint  Patient presents with  . Fluid retention    HPI [name] is a 78 y.o. male.  He has a history of A. fib on Eliquis, CHF, CKD, peripheral edema.  He has had a months worth of increased lower extremity edema worsening over the last few days.  He is on diuretics and is tried doubling the diuretics without any improvement.  He is concerned now because he has had some fluid leaking from the leg and some superficial ulcers starting.  He denies any fevers or chills no cough shortness of breath chest pain.  His doctors have told him to try to fluid restrict but he does not think he is been doing very well with that.  He has been compliant with his regular medications.     The history is provided by the patient.  Leg Pain Location:  Leg Leg location:  L lower leg and R lower leg Pain details:    Quality:  Pressure   Severity:  Moderate   Onset quality:  Gradual   Timing:  Constant   Progression:  Unchanged Chronicity:  New Relieved by:  Nothing Worsened by:  Activity Ineffective treatments:  Rest Associated symptoms: stiffness   Associated symptoms: no back pain and no fever     Past Medical History:  Diagnosis Date  . CHF (congestive heart failure) ([place])     There are no active problems to display for this patient.   History reviewed. No pertinent surgical history.      Home Medications    Prior to Admission medications   Not on File    Family History History reviewed. No pertinent family history.  Social History Social History   Tobacco Use  . Smoking status: Never Smoker  . Smokeless tobacco: Never Used  Substance Use Topics  . Alcohol use: Not Currently  . Drug use: Never     Allergies   Patient has no allergy information on record.   Review of Systems Review of Systems   Constitutional: Negative for fever.  HENT: Negative for sore throat.   Eyes: Negative for visual disturbance.  Respiratory: Negative for shortness of breath.   Cardiovascular: Positive for leg swelling. Negative for chest pain.  Gastrointestinal: Negative for abdominal pain.  Genitourinary: Negative for dysuria.  Musculoskeletal: Positive for gait problem and stiffness. Negative for back pain.  Skin: Positive for wound. Negative for rash.  Neurological: Negative for headaches.     Physical Exam Updated Vital Signs BP (!) 110/58   Pulse 81   Temp 98.4 F (36.9 C) (Oral)   Resp 18   SpO2 100%   Physical Exam Vitals signs and nursing note reviewed.  Constitutional:      Appearance: He is well-developed.  HENT:     Head: Normocephalic and atraumatic.  Eyes:     Conjunctiva/sclera: Conjunctivae normal.  Neck:     Musculoskeletal: Neck supple.  Cardiovascular:     Rate and Rhythm: Normal rate and regular rhythm.     Heart sounds: No murmur.  Pulmonary:     Effort: Pulmonary effort is normal. No respiratory distress.     Breath sounds: Normal breath sounds.  Abdominal:     Palpations: Abdomen is soft.     Tenderness: There is no abdominal tenderness.  Musculoskeletal:  General: Swelling and tenderness present.     Right lower leg: Edema present.     Left lower leg: Edema present.     Comments: Patient has market edema of his lower extremities mostly below the knees.  There is some superficial ulcers and some generalized erythema.  He is got a few clear filled blisters.  Not particularly hot.  Skin:    General: Skin is warm and dry.  Neurological:     General: No focal deficit present.     Mental Status: He is alert and oriented to person, place, and time.        ED Treatments / Results  Labs (all labs ordered are listed, but only abnormal results are displayed) Labs Reviewed  CBC WITH DIFFERENTIAL/PLATELET - Abnormal; Notable for the following components:       Result Value   WBC 13.7 (*)    RBC 3.84 (*)    Hemoglobin 12.2 (*)    HCT 37.9 (*)    Neutro Abs 11.5 (*)    All other components within normal limits  BASIC METABOLIC PANEL - Abnormal; Notable for the following components:   CO2 21 (*)    Glucose, Bld 143 (*)    BUN 64 (*)    Creatinine, Ser 2.35 (*)    GFR calc non Af Amer 25 (*)    GFR calc Af Amer 29 (*)    All other components within normal limits  LACTIC ACID, PLASMA - Abnormal; Notable for the following components:   Lactic Acid, Venous 2.2 (*)    All other components within normal limits  CULTURE, BLOOD (ROUTINE X 2)  CULTURE, BLOOD (ROUTINE X 2)  LACTIC ACID, PLASMA  MAGNESIUM    EKG EKG Interpretation  Date/Time:  Wednesday April 29 2019 11:34:11 EDT Ventricular Rate:  76 PR Interval:    QRS Duration: 126 QT Interval:  408 QTC Calculation: 459 R Axis:   -67 Text Interpretation:  Atrial fibrillation Nonspecific IVCD with LAD Inferolateral infarct, old new afib and IVCD since 5/08 Confirmed by Meridee ScoreButler, Chere Babson [phone]([id number]) on 04/29/2019 11:39:08 AM   Radiology No results found.  Procedures Procedures (including critical care time)  Medications Ordered in ED Medications  potassium chloride SA (KLOR-CON) CR tablet 20 mEq (20 mEq Oral Given 04/29/19 1244)  furosemide (LASIX) injection 80 mg (80 mg Intravenous Given 04/29/19 1244)  ceFAZolin (ANCEF) IVPB 1 g/50 mL premix (0 g Intravenous Stopped 04/29/19 1330)  sodium chloride 0.9 % bolus 500 mL (0 mLs Intravenous Stopped 04/29/19 1349)     Initial Impression / Assessment and Plan / ED Course  I have reviewed the triage vital signs and the nursing notes.  Pertinent labs & imaging results that were available during my care of the patient were reviewed by me and considered in my medical decision making (see chart for details).  Clinical Course as of Apr 28 1629  Wed Apr 29, 2019  [id number] 78 year old male with known CHF peripheral edema here with increased fluid  retention in his legs and some superficial wounds.  Differential includes fluid overload, renal failure, cellulitis.  Less likely DVT as it is bilateral and he is on anticoagulation.  Checking labs including renal function lactic acid blood cultures.  Likely will need IV diuresis potentially admission.   [MB]  1248 Creatinine last month was 1.9.  Currently 2.35.  Taking 80 of Lasix twice a day I believe.  Does have an elevated white count at 13.7.  Lactate mildly elevated at  2.2.  Blood pressure is soft here but afebrile looks very comfortable.   [MB]  1431 Placed in a call into the patient's PCP Dr. Edward Jolly but has not heard back yet.  I did place a home health face-to-face requested for the patient to get services at home.   [MB]    Clinical Course User Index [MB] Terrilee Files, MD       Final Clinical Impressions(s) / ED Diagnoses   Final diagnoses:  Peripheral edema  Chronic kidney disease, unspecified CKD stage    ED Discharge Orders         Ordered    cephALEXin (KEFLEX) 250 MG capsule  4 times daily     04/29/19 1440           Terrilee Files, MD 04/29/19 1631

## 2019-04-29 NOTE — ED Notes (Signed)
Called Dr Chipper Oman [place] on call line.  Left message for follow up call to our EDP,  Dr. Melina Copa.

## 2019-04-29 NOTE — Discharge Instructions (Addendum)
You were seen in the emergency department for evaluation of increased leg swelling and the possibility of an early skin infection.  You received an IV dose of diuretic along with some IV antibiotics and we are sending you home with some pill antibiotics.  We also are working on setting you up with home health aide to help you manage your symptoms.  If you notice worsening redness or fever or other concerns please return to the emergency department.  Please call your primary care doctor and/or your nephrologist for close follow-up.

## 2019-04-29 NOTE — ED Notes (Signed)
Pt spoke to Education officer, museum regarding information for Home Health.  Social Worker Leeroy Cha will arrange for initial visit.

## 2019-04-29 NOTE — ED Notes (Signed)
Pt on monitor 

## 2019-04-29 NOTE — TOC Initial Note (Addendum)
Transition of Care [facility]) - Initial/Assessment Note    Patient Details  Name: [name] MRN: [id number] Date of Birth: 1940/12/28  [facility]) CM/SW Contact:    Janace Hoard, LCSW Phone Number: 04/29/2019, 2:46 PM  Clinical Narrative:                 CSW spoke with patient via telephone. Patient reports he went to his PCP's office for [facility] needs then his PCP recommended he come to [facility]. Patient reports he has never had Britt in the past, but his mother who is now deceased did. Patient reports he lives alone. Patient reports he is having some issues with balance and getting around as he once did. Patient reports he uses a cane, mostly when he leaves his home. He is able to get around his home without it most times. Although patient lives alone, he reports his brother lives less than a mile away from him. Patient agreeable to have CSW find him [facility] agency.   CSW spoke with Glyn Ade with [facility] who will accept patient.   Expected Discharge Plan: [place] Barriers to Discharge: No Barriers Identified   Patient Goals and CMS Choice Patient states their goals for this hospitalization and ongoing recovery are:: to get help with balance when walking     Expected Discharge Plan and Services Expected Discharge Plan: [place] In-house Referral: Clinical Social Work     Living arrangements for the past 2 months: Single Family Home                           HH Arranged: PT, Nurse's Aide, RN [place] Agency: [facility] Date [place]: 04/29/19 Time [place]: [place] Representative spoke with at [place]: Glyn Ade  Prior Living Arrangements/Services Living arrangements for the past 2 months: Single Family Home Lives with:: Self Patient language and need for interpreter reviewed:: Yes Do you feel safe going back to the place where you live?: Yes      Need for Family Participation in Patient  Care: Yes (Comment) Care giver support system in place?: Yes (comment) Current home services: DME(Uses a cane) Criminal Activity/Legal Involvement Pertinent to Current Situation/Hospitalization: No - Comment as needed  Activities of Daily Living      Permission Sought/Granted Permission sought to share information with : Facility Art therapist granted to share information with : Yes, Verbal Permission Granted     Permission granted to share info w AGENCY: [place]        Emotional Assessment Appearance:: (Unable to assess) Attitude/Demeanor/Rapport: Unable to Assess Affect (typically observed): Unable to Assess Orientation: : Oriented to Self, Oriented to Place, Oriented to  Time, Oriented to Situation      Admission diagnosis:  RETAIN FLUID, BLISTERS ON LEGS There are no active problems to display for this patient.  PCP:  Patrecia Pour, Christean Grief, MD Pharmacy:   [facility] [address] Phone: [phone] Fax: [phone]     Social Determinants of Health (SDOH) Interventions    Readmission Risk Interventions No flowsheet data found.

## 2019-04-29 NOTE — ED Notes (Signed)
MD aware of Lactic Acid result, new orders received.

## 2019-05-04 LAB — CULTURE, BLOOD (ROUTINE X 2)
Culture: NO GROWTH
Culture: NO GROWTH
Special Requests: ADEQUATE

## 2021-04-22 DEATH — deceased
